# Patient Record
Sex: Male | Born: 1990 | Race: Black or African American | Hispanic: No | Marital: Single | Smoking: Never smoker
Health system: Southern US, Community
[De-identification: ages and names within clinical notes are randomized; demographics above are authoritative.]

## PROBLEM LIST (undated history)

## (undated) DIAGNOSIS — E119 Type 2 diabetes mellitus without complications: Secondary | ICD-10-CM

---

## 2014-10-01 ENCOUNTER — Emergency Department (HOSPITAL_COMMUNITY)
Admission: EM | Admit: 2014-10-01 | Discharge: 2014-10-01 | Disposition: A | Discharge status: Home / Self Care | Payer: PRIVATE HEALTH INSURANCE | Attending: Emergency Medicine | Admitting: Emergency Medicine

## 2014-10-01 ENCOUNTER — Encounter (HOSPITAL_COMMUNITY): Payer: Self-pay | Admitting: Emergency Medicine

## 2014-10-01 DIAGNOSIS — R739 Hyperglycemia, unspecified: Secondary | ICD-10-CM

## 2014-10-01 DIAGNOSIS — E1165 Type 2 diabetes mellitus with hyperglycemia: Secondary | ICD-10-CM | POA: Insufficient documentation

## 2014-10-01 DIAGNOSIS — K5900 Constipation, unspecified: Secondary | ICD-10-CM | POA: Insufficient documentation

## 2014-10-01 DIAGNOSIS — Z79899 Other long term (current) drug therapy: Secondary | ICD-10-CM | POA: Diagnosis not present

## 2014-10-01 HISTORY — DX: Type 2 diabetes mellitus without complications: E11.9

## 2014-10-01 LAB — CBC WITH DIFFERENTIAL/PLATELET
BASOS ABS: 0 10*3/uL (ref 0.0–0.1)
BASOS PCT: 1 % (ref 0–1)
Eosinophils Absolute: 0.1 10*3/uL (ref 0.0–0.7)
Eosinophils Relative: 2 % (ref 0–5)
HCT: 41.5 % (ref 39.0–52.0)
HEMOGLOBIN: 12.9 g/dL — AB (ref 13.0–17.0)
Lymphocytes Relative: 43 % (ref 12–46)
Lymphs Abs: 2.2 10*3/uL (ref 0.7–4.0)
MCH: 24.3 pg — ABNORMAL LOW (ref 26.0–34.0)
MCHC: 31.1 g/dL (ref 30.0–36.0)
MCV: 78.2 fL (ref 78.0–100.0)
Monocytes Absolute: 0.5 10*3/uL (ref 0.1–1.0)
Monocytes Relative: 10 % (ref 3–12)
Neutro Abs: 2.4 10*3/uL (ref 1.7–7.7)
Neutrophils Relative %: 44 % (ref 43–77)
Platelets: 266 10*3/uL (ref 150–400)
RBC: 5.31 MIL/uL (ref 4.22–5.81)
RDW: 13.5 % (ref 11.5–15.5)
WBC: 5.2 10*3/uL (ref 4.0–10.5)

## 2014-10-01 LAB — CBG MONITORING, ED: GLUCOSE-CAPILLARY: 235 mg/dL — AB (ref 70–99)

## 2014-10-01 LAB — URINALYSIS, ROUTINE W REFLEX MICROSCOPIC
Bilirubin Urine: NEGATIVE
HGB URINE DIPSTICK: NEGATIVE
Ketones, ur: NEGATIVE mg/dL
Leukocytes, UA: NEGATIVE
Nitrite: NEGATIVE
Protein, ur: NEGATIVE mg/dL
SPECIFIC GRAVITY, URINE: 1.041 — AB (ref 1.005–1.030)
Urobilinogen, UA: 0.2 mg/dL (ref 0.0–1.0)
pH: 6 (ref 5.0–8.0)

## 2014-10-01 LAB — COMPREHENSIVE METABOLIC PANEL
ALBUMIN: 4 g/dL (ref 3.5–5.2)
ALK PHOS: 50 U/L (ref 39–117)
ALT: 27 U/L (ref 0–53)
AST: 22 U/L (ref 0–37)
Anion gap: 6 (ref 5–15)
BUN: 7 mg/dL (ref 6–23)
CO2: 25 mmol/L (ref 19–32)
Calcium: 8.9 mg/dL (ref 8.4–10.5)
Chloride: 105 mmol/L (ref 96–112)
Creatinine, Ser: 0.61 mg/dL (ref 0.50–1.35)
GFR calc Af Amer: >90 mL/min (ref >90)
GFR calc non Af Amer: >90 mL/min (ref >90)
Glucose, Bld: 277 mg/dL — ABNORMAL HIGH (ref 70–99)
POTASSIUM: 3.8 mmol/L (ref 3.5–5.1)
SODIUM: 136 mmol/L (ref 135–145)
Total Bilirubin: 0.7 mg/dL (ref 0.3–1.2)
Total Protein: 7.3 g/dL (ref 6.0–8.3)

## 2014-10-01 LAB — URINE MICROSCOPIC-ADD ON

## 2014-10-01 LAB — LIPASE, BLOOD: Lipase: 18 U/L (ref 11–59)

## 2014-10-01 MED ORDER — SODIUM CHLORIDE 0.9 % IV BOLUS (SEPSIS)
500.0000 mL | Freq: Once | INTRAVENOUS | Status: AC
  Administered 2014-10-01: 500 mL via INTRAVENOUS

## 2014-10-01 MED ORDER — SENNOSIDES-DOCUSATE SODIUM 8.6-50 MG PO TABS: 1.0000 | ORAL_TABLET | Freq: Every evening | ORAL | Status: AC | PRN

## 2014-10-01 MED ORDER — SENNOSIDES-DOCUSATE SODIUM 8.6-50 MG PO TABS
1.0000 | ORAL_TABLET | Freq: Once | ORAL | Status: AC
  Administered 2014-10-01: 1 via ORAL
  Filled 2014-10-01: qty 1

## 2014-10-01 MED ORDER — SODIUM CHLORIDE 0.9 % IV BOLUS (SEPSIS): 1000.0000 mL | Freq: Once | INTRAVENOUS | Status: DC

## 2014-10-01 NOTE — ED Provider Notes (Signed)
CSN: [id number]     Arrival date & time 10/01/14  1029 History   First MD Initiated Contact with Patient 10/01/14 1051     Chief Complaint  Patient presents with  . Hyperglycemia     (Consider location/radiation/quality/duration/timing/severity/associated sxs/prior Treatment) HPI   William Hoffman is a 24 y.o. male with past medical history significant for non-insulin-dependent diabetes complaining of elevated blood glucose which he checked this morning (366) patient states that he has been running higher sugars and normal lately, states that he has pressure in the lower abdomen, he denies pain, nausea, vomiting, change in urination, fever, chills, cough, chest pain, shortness of breath testicular pain, swelling, urethral discharge, rash. Review of systems patient notes that he had a bowel movement this morning but he doesn't feel that he was fully relieved. His last good bowel movement was 2 days ago. No pain with defecation, bright red blood per rectum, melena.   Past Medical History  Diagnosis Date  . Diabetes mellitus without complication    History reviewed. No pertinent past surgical history. History reviewed. No pertinent family history. History  Substance Use Topics  . Smoking status: Never Smoker   . Smokeless tobacco: Not on file  . Alcohol Use: Yes     Comment: occasional    Review of Systems  10 systems reviewed and found to be negative, except as noted in the HPI.   Allergies  Review of patient's allergies indicates no known allergies.  Home Medications   Prior to Admission medications   Medication Sig Start Date End Date Taking? Authorizing Provider  glipiZIDE (GLUCOTROL) 10 MG tablet Take 10 mg by mouth 2 (two) times daily before a meal.   Yes Historical Provider, MD  metFORMIN (GLUCOPHAGE) 1000 MG tablet Take 1,000 mg by mouth 2 (two) times daily with a meal.   Yes Historical Provider, MD  Vitamin D, Ergocalciferol, (DRISDOL) 50000 UNITS CAPS capsule Take  50,000 Units by mouth every 7 (seven) days.   Yes Historical Provider, MD  senna-docusate (SENOKOT-S) 8.6-50 MG per tablet Take 1 tablet by mouth at bedtime as needed for mild constipation. 10/01/14   Ronaldo Crilly, PA-C   BP 114/63 mmHg  Pulse 61  Temp(Src) 97.2 F (36.2 C) (Oral)  Resp 17  SpO2 100% Physical Exam  Constitutional: He is oriented to person, place, and time. He appears well-developed and well-nourished. No distress.  HENT:  Head: Normocephalic and atraumatic.  Mouth/Throat: Oropharynx is clear and moist.  Eyes: Conjunctivae and EOM are normal. Pupils are equal, round, and reactive to light.  Neck: Normal range of motion.  Cardiovascular: Normal rate, regular rhythm and intact distal pulses.   Pulmonary/Chest: Effort normal and breath sounds normal. No stridor. No respiratory distress. He has no wheezes. He has no rales. He exhibits no tenderness.  Abdominal: Soft. Bowel sounds are normal. He exhibits no distension and no mass. There is no tenderness. There is no rebound and no guarding.  Musculoskeletal: Normal range of motion.  Neurological: He is alert and oriented to person, place, and time.  Psychiatric: He has a normal mood and affect.  Nursing note and vitals reviewed.   ED Course  Procedures (including critical care time) Labs Review Labs Reviewed  CBC WITH DIFFERENTIAL/PLATELET - Abnormal; Notable for the following:    Hemoglobin 12.9 (*)    MCH 24.3 (*)    All other components within normal limits  COMPREHENSIVE METABOLIC PANEL - Abnormal; Notable for the following:    Glucose, Bld 277 (*)  All other components within normal limits  URINALYSIS, ROUTINE W REFLEX MICROSCOPIC - Abnormal; Notable for the following:    Specific Gravity, Urine 1.041 (*)    Glucose, UA >1000 (*)    All other components within normal limits  CBG MONITORING, ED - Abnormal; Notable for the following:    Glucose-Capillary 235 (*)    All other components within normal limits   LIPASE, BLOOD  URINE MICROSCOPIC-ADD ON    Imaging Review No results found.   EKG Interpretation None      MDM   Final diagnoses:  Hyperglycemia without ketosis  Constipation, unspecified constipation type    Filed Vitals:   10/01/14 1038 10/01/14 1300  BP: 148/75 114/63  Pulse: 96 61  Temp: 97.2 F (36.2 C)   TempSrc: Oral   Resp: 18 17  SpO2: 100% 100%    Medications  sodium chloride 0.9 % bolus 500 mL (500 mLs Intravenous New Bag/Given 10/01/14 1301)  senna-docusate (Senokot-S) tablet 1 tablet (1 tablet Oral Given 10/01/14 1313)    Virgel ManifoldMalcolm Hoffman is a pleasant 24 y.o. male presenting with elevated blood glucose under 400 at home with associated lower abdominal feeling of pressure and distention. Likely secondary to mild constipation. Abdominal exam is benign. Blood glucose is elevated however he has a normal anion gap and is tolerating by mouth's. His urine is concentrated with a specific gravity of 1.041. Patient will be given 500 mL bolus, and encouraged him to start fresh fruits and vegetables, exercise regularly right him a prescription for Senokot.  Evaluation does not show pathology that would require ongoing emergent intervention or inpatient treatment. Pt is hemodynamically stable and mentating appropriately. Discussed findings and plan with patient/guardian, who agrees with care plan. All questions answered. Return precautions discussed and outpatient follow up given.   New Prescriptions   SENNA-DOCUSATE (SENOKOT-S) 8.6-50 MG PER TABLET    Take 1 tablet by mouth at bedtime as needed for mild constipation.         Wynetta Emeryicole Atheena Spano, PA-C 10/01/14 1341  Bethann BerkshireJoseph Zammit, MD 10/01/14 1531

## 2014-10-01 NOTE — ED Notes (Signed)
Pt denies n/v/d but reports abdominal pain for a week. Pt reports increased CBG; last took diabetic medication yesterday.

## 2014-10-01 NOTE — ED Notes (Signed)
Pt c/o abdominal pain and hyperglycemia onset today. Pt states CBG was 366 at home.

## 2014-10-01 NOTE — Discharge Instructions (Signed)
Please follow with your primary care doctor in the next 2 days for a check-up. They must obtain records for further management.   Do not hesitate to return to the Emergency Department for any new, worsening or concerning symptoms.   Take magnesium citrate 150mg  every 12 hours. You should continue to take Senakot. If this you have no relief in the the next 24 hours you should start taking miralax until you have a bowel movement. You may also try a fleet enema which are available over -the counter  Constipation Constipation is when a person has fewer than three bowel movements a week, has difficulty having a bowel movement, or has stools that are dry, hard, or larger than normal. As people grow older, constipation is more common. If you try to fix constipation with medicines that make you have a bowel movement (laxatives), the problem may get worse. Long-term laxative use may cause the muscles of the colon to become weak. A low-fiber diet, not taking in enough fluids, and taking certain medicines may make constipation worse.  CAUSES   Certain medicines, such as antidepressants, pain medicine, iron supplements, antacids, and water pills.   Certain diseases, such as diabetes, irritable bowel syndrome (IBS), thyroid disease, or depression.   Not drinking enough water.   Not eating enough fiber-rich foods.   Stress or travel.   Lack of physical activity or exercise.   Ignoring the urge to have a bowel movement.   Using laxatives too much.  SIGNS AND SYMPTOMS   Having fewer than three bowel movements a week.   Straining to have a bowel movement.   Having stools that are hard, dry, or larger than normal.   Feeling full or bloated.   Pain in the lower abdomen.   Not feeling relief after having a bowel movement.  DIAGNOSIS  Your health care provider will take a medical history and perform a physical exam. Further testing may be done for severe constipation. Some tests may  include:  A barium enema X-ray to examine your rectum, colon, and, sometimes, your small intestine.   A sigmoidoscopy to examine your lower colon.   A colonoscopy to examine your entire colon. TREATMENT  Treatment will depend on the severity of your constipation and what is causing it. Some dietary treatments include drinking more fluids and eating more fiber-rich foods. Lifestyle treatments may include regular exercise. If these diet and lifestyle recommendations do not help, your health care provider may recommend taking over-the-counter laxative medicines to help you have bowel movements. Prescription medicines may be prescribed if over-the-counter medicines do not work.  HOME CARE INSTRUCTIONS   Eat foods that have a lot of fiber, such as fruits, vegetables, whole grains, and beans.  Limit foods high in fat and processed sugars, such as french fries, hamburgers, cookies, candies, and soda.   A fiber supplement may be added to your diet if you cannot get enough fiber from foods.   Drink enough fluids to keep your urine clear or pale yellow.   Exercise regularly or as directed by your health care provider.   Go to the restroom when you have the urge to go. Do not hold it.   Only take over-the-counter or prescription medicines as directed by your health care provider. Do not take other medicines for constipation without talking to your health care provider first.  SEEK IMMEDIATE MEDICAL CARE IF:   You have bright red blood in your stool.   Your constipation lasts for more than  4 days or gets worse.   You have abdominal or rectal pain.   You have thin, pencil-like stools.   You have unexplained weight loss. MAKE SURE YOU:   Understand these instructions.  Will watch your condition.  Will get help right away if you are not doing well or get worse. Document Released: 03/04/2004 Document Revised: 06/11/2013 Document Reviewed: 03/18/2013 [facility] Patient  Information 2015 [place], [place]. This information is not intended to replace advice given to you by your health care provider. Make sure you discuss any questions you have with your health care provider.

## 2014-10-01 NOTE — ED Notes (Signed)
AVS explained in detail. Acknowledges he is to follow up with PCP in 2 days for further DM II management. Knows to increase fluids and alter diet with high fiber foods and to take Senakot for mild constipation. No other c/c. Ambulatory. AVS in hand.

## 2015-04-25 ENCOUNTER — Emergency Department (HOSPITAL_COMMUNITY): Payer: PRIVATE HEALTH INSURANCE

## 2015-04-25 ENCOUNTER — Emergency Department (HOSPITAL_COMMUNITY)
Admission: EM | Admit: 2015-04-25 | Discharge: 2015-04-25 | Disposition: A | Discharge status: Home / Self Care | Payer: PRIVATE HEALTH INSURANCE | Attending: Emergency Medicine | Admitting: Emergency Medicine

## 2015-04-25 ENCOUNTER — Encounter (HOSPITAL_COMMUNITY): Payer: Self-pay | Admitting: *Deleted

## 2015-04-25 DIAGNOSIS — R1084 Generalized abdominal pain: Secondary | ICD-10-CM | POA: Insufficient documentation

## 2015-04-25 DIAGNOSIS — E1165 Type 2 diabetes mellitus with hyperglycemia: Secondary | ICD-10-CM | POA: Diagnosis present

## 2015-04-25 DIAGNOSIS — Z79899 Other long term (current) drug therapy: Secondary | ICD-10-CM | POA: Diagnosis not present

## 2015-04-25 DIAGNOSIS — R350 Frequency of micturition: Secondary | ICD-10-CM

## 2015-04-25 DIAGNOSIS — R739 Hyperglycemia, unspecified: Secondary | ICD-10-CM

## 2015-04-25 DIAGNOSIS — J069 Acute upper respiratory infection, unspecified: Secondary | ICD-10-CM

## 2015-04-25 DIAGNOSIS — R05 Cough: Secondary | ICD-10-CM

## 2015-04-25 DIAGNOSIS — R059 Cough, unspecified: Secondary | ICD-10-CM

## 2015-04-25 LAB — CBC WITH DIFFERENTIAL/PLATELET
BASOS ABS: 0 10*3/uL (ref 0.0–0.1)
Basophils Relative: 0 %
EOS PCT: 3 %
Eosinophils Absolute: 0.1 10*3/uL (ref 0.0–0.7)
HEMATOCRIT: 44.4 % (ref 39.0–52.0)
HEMOGLOBIN: 14.4 g/dL (ref 13.0–17.0)
LYMPHS ABS: 2.4 10*3/uL (ref 0.7–4.0)
LYMPHS PCT: 48 %
MCH: 24.8 pg — AB (ref 26.0–34.0)
MCHC: 32.4 g/dL (ref 30.0–36.0)
MCV: 76.4 fL — AB (ref 78.0–100.0)
Monocytes Absolute: 0.5 10*3/uL (ref 0.1–1.0)
Monocytes Relative: 10 %
NEUTROS PCT: 39 %
Neutro Abs: 2 10*3/uL (ref 1.7–7.7)
PLATELETS: 204 10*3/uL (ref 150–400)
RBC: 5.81 MIL/uL (ref 4.22–5.81)
RDW: 13.6 % (ref 11.5–15.5)
WBC: 5 10*3/uL (ref 4.0–10.5)

## 2015-04-25 LAB — COMPREHENSIVE METABOLIC PANEL
ALT: 21 U/L (ref 17–63)
AST: 29 U/L (ref 15–41)
Albumin: 3.8 g/dL (ref 3.5–5.0)
Alkaline Phosphatase: 46 U/L (ref 38–126)
Anion gap: 6 (ref 5–15)
BILIRUBIN TOTAL: 0.9 mg/dL (ref 0.3–1.2)
BUN: 7 mg/dL (ref 6–20)
CHLORIDE: 104 mmol/L (ref 101–111)
CO2: 26 mmol/L (ref 22–32)
Calcium: 9.1 mg/dL (ref 8.9–10.3)
Creatinine, Ser: 0.72 mg/dL (ref 0.61–1.24)
GFR calc non Af Amer: >60 mL/min (ref >60)
Glucose, Bld: 272 mg/dL — ABNORMAL HIGH (ref 65–99)
Potassium: 4.9 mmol/L (ref 3.5–5.1)
Sodium: 136 mmol/L (ref 135–145)
Total Protein: 6.9 g/dL (ref 6.5–8.1)

## 2015-04-25 LAB — URINALYSIS, ROUTINE W REFLEX MICROSCOPIC
Bilirubin Urine: NEGATIVE
Glucose, UA: >1000 mg/dL — AB
HGB URINE DIPSTICK: NEGATIVE
Ketones, ur: 15 mg/dL — AB
LEUKOCYTES UA: NEGATIVE
Nitrite: NEGATIVE
PH: 5.5 (ref 5.0–8.0)
Protein, ur: NEGATIVE mg/dL
Specific Gravity, Urine: >1.046 — ABNORMAL HIGH (ref 1.005–1.030)
Urobilinogen, UA: 0.2 mg/dL (ref 0.0–1.0)

## 2015-04-25 LAB — I-STAT VENOUS BLOOD GAS, ED
ACID-BASE EXCESS: 6 mmol/L — AB (ref 0.0–2.0)
Bicarbonate: 32 mEq/L — ABNORMAL HIGH (ref 20.0–24.0)
O2 SAT: 44 %
PCO2 VEN: 49.2 mmHg (ref 45.0–50.0)
PO2 VEN: 24 mmHg — AB (ref 30.0–45.0)
TCO2: 33 mmol/L (ref 0–100)
pH, Ven: 7.421 — ABNORMAL HIGH (ref 7.250–7.300)

## 2015-04-25 LAB — URINE MICROSCOPIC-ADD ON

## 2015-04-25 LAB — LIPASE, BLOOD: Lipase: 20 U/L (ref 11–51)

## 2015-04-25 LAB — PHOSPHORUS: Phosphorus: 3.1 mg/dL (ref 2.5–4.6)

## 2015-04-25 LAB — CBG MONITORING, ED: Glucose-Capillary: 186 mg/dL — ABNORMAL HIGH (ref 65–99)

## 2015-04-25 LAB — MAGNESIUM: Magnesium: 1.8 mg/dL (ref 1.7–2.4)

## 2015-04-25 MED ORDER — SODIUM CHLORIDE 0.9 % IV BOLUS (SEPSIS)
2000.0000 mL | Freq: Once | INTRAVENOUS | Status: AC
  Administered 2015-04-25: 2000 mL via INTRAVENOUS

## 2015-04-25 NOTE — Discharge Instructions (Signed)
Increase water intake, decrease your carbohydrate intake. Follow up with your regular doctor in 1-2 weeks for recheck of symptoms and adjustment of medications. Continue to alternate between Tylenol and Ibuprofen for pain or fever. Use Mucinex for cough suppression/expectoration of mucus. Use netipot and flonase to help with nasal congestion. May consider over-the-counter Benadryl or other antihistamine to decrease secretions and for watery itchy eyes. Return to emergency department for emergent changing or worsening of symptoms.  Abdominal (belly) pain can be caused by many things. Your caregiver performed an examination and possibly ordered blood/urine tests and imaging (CT scan, x-rays, ultrasound). Many cases can be observed and treated at home after initial evaluation in the emergency department. Even though you are being discharged home, abdominal pain can be unpredictable. Therefore, you need a repeated exam if your pain does not resolve, returns, or worsens. Most patients with abdominal pain don't have to be admitted to the hospital or have surgery, but serious problems like appendicitis and gallbladder attacks can start out as nonspecific pain. Many abdominal conditions cannot be diagnosed in one visit, so follow-up evaluations are very important. SEEK IMMEDIATE MEDICAL ATTENTION IF YOU DEVELOP ANY OF THE FOLLOWING SYMPTOMS:  The pain does not go away or becomes severe.   A temperature above 101 develops.   Repeated vomiting occurs (multiple episodes).   The pain becomes localized to portions of the abdomen. The right side could possibly be appendicitis. In an adult, the left lower portion of the abdomen could be colitis or diverticulitis.   Blood is being passed in stools or vomit (bright red or black tarry stools).   Return also if you develop chest pain, difficulty breathing, dizziness or fainting, or become confused, poorly responsive, or inconsolable (young children).  The constipation  stays for more than 4 days.   There is belly (abdominal) or rectal pain.   You do not seem to be getting better.     Hyperglycemia Hyperglycemia occurs when the glucose (sugar) in your blood is too high. Hyperglycemia can happen for many reasons, but it most often happens to people who do not know they have diabetes or are not managing their diabetes properly.  CAUSES  Whether you have diabetes or not, there are other causes of hyperglycemia. Hyperglycemia can occur when you have diabetes, but it can also occur in other situations that you might not be as aware of, such as: Diabetes  If you have diabetes and are having problems controlling your blood glucose, hyperglycemia could occur because of some of the following reasons:  Not following your meal plan.  Not taking your diabetes medications or not taking it properly.  Exercising less or doing less activity than you normally do.  Being sick. Pre-diabetes  This cannot be ignored. Before people develop Type 2 diabetes, they almost always have "pre-diabetes." This is when your blood glucose levels are higher than normal, but not yet high enough to be diagnosed as diabetes. Research has shown that some long-term damage to the body, especially the heart and circulatory system, may already be occurring during pre-diabetes. If you take action to manage your blood glucose when you have pre-diabetes, you may delay or prevent Type 2 diabetes from developing. Stress  If you have diabetes, you may be "diet" controlled or on oral medications or insulin to control your diabetes. However, you may find that your blood glucose is higher than usual in the hospital whether you have diabetes or not. This is often referred to as "stress hyperglycemia."  Stress can elevate your blood glucose. This happens because of hormones put out by the body during times of stress. If stress has been the cause of your high blood glucose, it can be followed regularly by your  caregiver. That way he/she can make sure your hyperglycemia does not continue to get worse or progress to diabetes. Steroids  Steroids are medications that act on the infection fighting system (immune system) to block inflammation or infection. One side effect can be a rise in blood glucose. Most people can produce enough extra insulin to allow for this rise, but for those who cannot, steroids make blood glucose levels go even higher. It is not unusual for steroid treatments to "uncover" diabetes that is developing. It is not always possible to determine if the hyperglycemia will go away after the steroids are stopped. A special blood test called an A1c is sometimes done to determine if your blood glucose was elevated before the steroids were started. SYMPTOMS  Thirsty.  Frequent urination.  Dry mouth.  Blurred vision.  Tired or fatigue.  Weakness.  Sleepy.  Tingling in feet or leg. DIAGNOSIS  Diagnosis is made by monitoring blood glucose in one or all of the following ways:  A1c test. This is a chemical found in your blood.  Fingerstick blood glucose monitoring.  Laboratory results. TREATMENT  First, knowing the cause of the hyperglycemia is important before the hyperglycemia can be treated. Treatment may include, but is not be limited to:  Education.  Change or adjustment in medications.  Change or adjustment in meal plan.  Treatment for an illness, infection, etc.  More frequent blood glucose monitoring.  Change in exercise plan.  Decreasing or stopping steroids.  Lifestyle changes. HOME CARE INSTRUCTIONS   Test your blood glucose as directed.  Exercise regularly. Your caregiver will give you instructions about exercise. Pre-diabetes or diabetes which comes on with stress is helped by exercising.  Eat wholesome, balanced meals. Eat often and at regular, fixed times. Your caregiver or nutritionist will give you a meal plan to guide your sugar intake.  Being at  an ideal weight is important. If needed, losing as little as 10 to 15 pounds may help improve blood glucose levels. SEEK MEDICAL CARE IF:   You have questions about medicine, activity, or diet.  You continue to have symptoms (problems such as increased thirst, urination, or weight gain). SEEK IMMEDIATE MEDICAL CARE IF:   You are vomiting or have diarrhea.  Your breath smells fruity.  You are breathing faster or slower.  You are very sleepy or incoherent.  You have numbness, tingling, or pain in your feet or hands.  You have chest pain.  Your symptoms get worse even though you have been following your caregiver's orders.  If you have any other questions or concerns.   This information is not intended to replace advice given to you by your health care provider. Make sure you discuss any questions you have with your health care provider.   Document Released: 11/30/2000 Document Revised: 08/29/2011 Document Reviewed: 02/10/2015 Elsevier Interactive Patient Education 2016 [employer]  Upper Respiratory Infection, Adult Most upper respiratory infections (URIs) are caused by a virus. A URI affects the nose, throat, and upper air passages. The most common type of URI is often called "the common cold." HOME CARE   Take medicines only as told by your doctor.  Gargle warm saltwater or take cough drops to comfort your throat as told by your doctor.  Use a  warm mist humidifier or inhale steam from a shower to increase air moisture. This may make it easier to breathe.  Drink enough fluid to keep your pee (urine) clear or pale yellow.  Eat soups and other clear broths.  Have a healthy diet.  Rest as needed.  Go back to work when your fever is gone or your doctor says it is okay.  You may need to stay home longer to avoid giving your URI to others.  You can also wear a face mask and wash your hands often to prevent spread of the virus.  Use your inhaler more if you have  asthma.  Do not use any tobacco products, including cigarettes, chewing tobacco, or electronic cigarettes. If you need help quitting, ask your doctor. GET HELP IF:  You are getting worse, not better.  Your symptoms are not helped by medicine.  You have chills.  You are getting more short of breath.  You have brown or red mucus.  You have yellow or brown discharge from your nose.  You have pain in your face, especially when you bend forward.  You have a fever.  You have puffy (swollen) neck glands.  You have pain while swallowing.  You have white areas in the back of your throat. GET HELP RIGHT AWAY IF:   You have very bad or constant:  Headache.  Ear pain.  Pain in your forehead, behind your eyes, and over your cheekbones (sinus pain).  Chest pain.  You have long-lasting (chronic) lung disease and any of the following:  Wheezing.  Long-lasting cough.  Coughing up blood.  A change in your usual mucus.  You have a stiff neck.  You have changes in your:  Vision.  Hearing.  Thinking.  Mood. MAKE SURE YOU:   Understand these instructions.  Will watch your condition.  Will get help right away if you are not doing well or get worse.   This information is not intended to replace advice given to you by your health care provider. Make sure you discuss any questions you have with your health care provider.   Document Released: 11/23/2007 Document Revised: 10/21/2014 Document Reviewed: 09/11/2013 Elsevier Interactive Patient Education 2016 [employer]  Prediabetes Eating Plan Prediabetes--also called impaired glucose tolerance or impaired fasting glucose--is a condition that causes blood sugar (blood glucose) levels to be higher than normal. Following a healthy diet can help to keep prediabetes under control. It can also help to lower the risk of type 2 diabetes and heart disease, which are increased in people who have prediabetes. Along with regular  exercise, a healthy diet:  Promotes weight loss.  Helps to control blood sugar levels.  Helps to improve the way that the body uses insulin. WHAT DO I NEED TO KNOW ABOUT THIS EATING PLAN?  Use the glycemic index (GI) to plan your meals. The index tells you how quickly a food will raise your blood sugar. Choose low-GI foods. These foods take a longer time to raise blood sugar.  Pay close attention to the amount of carbohydrates in the food that you eat. Carbohydrates increase blood sugar levels.  Keep track of how many calories you take in. Eating the right amount of calories will help you to achieve a healthy weight. Losing about 7 percent of your starting weight can help to prevent type 2 diabetes.  You may want to follow a Mediterranean diet. This diet includes a lot of vegetables, lean meats or fish, whole grains, fruits, and  healthy oils and fats. WHAT FOODS CAN I EAT? Grains Whole grains, such as whole-wheat or whole-grain breads, crackers, cereals, and pasta. Unsweetened oatmeal. Bulgur. Barley. Quinoa. Brown rice. Corn or whole-wheat flour tortillas or taco shells. Vegetables Lettuce. Spinach. Peas. Beets. Cauliflower. Cabbage. Broccoli. Carrots. Tomatoes. Squash. Eggplant. Herbs. Peppers. Onions. Cucumbers. Brussels sprouts. Fruits Berries. Bananas. Apples. Oranges. Grapes. Papaya. Mango. Pomegranate. Kiwi. Grapefruit. Cherries. Meats and Other Protein Sources Seafood. Lean meats, such as chicken and [place] or lean cuts of pork and beef. Tofu. Eggs. Nuts. Beans. Dairy Low-fat or fat-free dairy products, such as yogurt, cottage cheese, and cheese. Beverages Water. Tea. Coffee. Sugar-free or diet soda. Seltzer water. Milk. Milk alternatives, such as soy or almond milk. Condiments Mustard. Relish. Low-fat, low-sugar ketchup. Low-fat, low-sugar barbecue sauce. Low-fat or fat-free mayonnaise. Sweets and Desserts Sugar-free or low-fat pudding. Sugar-free or low-fat ice cream and  other frozen treats. Fats and Oils Avocado. Walnuts. Olive oil. The items listed above may not be a complete list of recommended foods or beverages. Contact your dietitian for more options.  WHAT FOODS ARE NOT RECOMMENDED? Grains Refined white flour and flour products, such as bread, pasta, snack foods, and cereals. Beverages Sweetened drinks, such as sweet iced tea and soda. Sweets and Desserts Baked goods, such as cake, cupcakes, pastries, cookies, and cheesecake. The items listed above may not be a complete list of foods and beverages to avoid. Contact your dietitian for more information.   This information is not intended to replace advice given to you by your health care provider. Make sure you discuss any questions you have with your health care provider.   Document Released: 10/21/2014 Document Reviewed: 10/21/2014 Elsevier Interactive Patient Education 2016 [employer]  Abdominal Pain, Adult Many things can cause belly (abdominal) pain. Most times, the belly pain is not dangerous. Many cases of belly pain can be watched and treated at home. HOME CARE   Do not take medicines that help you go poop (laxatives) unless told to by your doctor.  Only take medicine as told by your doctor.  Eat or drink as told by your doctor. Your doctor will tell you if you should be on a special diet. GET HELP IF:  You do not know what is causing your belly pain.  You have belly pain while you are sick to your stomach (nauseous) or have runny poop (diarrhea).  You have pain while you pee or poop.  Your belly pain wakes you up at night.  You have belly pain that gets worse or better when you eat.  You have belly pain that gets worse when you eat fatty foods.  You have a fever. GET HELP RIGHT AWAY IF:   The pain does not go away within 2 hours.  You keep throwing up (vomiting).  The pain changes and is only in the right or left part of the belly.  You have bloody or tarry looking  poop. MAKE SURE YOU:   Understand these instructions.  Will watch your condition.  Will get help right away if you are not doing well or get worse.   This information is not intended to replace advice given to you by your health care provider. Make sure you discuss any questions you have with your health care provider.   Document Released: 11/23/2007 Document Revised: 06/27/2014 Document Reviewed: 02/13/2013 Elsevier Interactive Patient Education [employer]

## 2015-04-25 NOTE — ED Notes (Signed)
Pt reports high cbg >200 and frequent urination, today cbg 366.

## 2015-04-25 NOTE — ED Provider Notes (Signed)
CSN: [id number]     Arrival date & time 04/25/15  1107 History   First MD Initiated Contact with Patient 04/25/15 1114     Chief Complaint  Patient presents with  . Hyperglycemia     (Consider location/radiation/quality/duration/timing/severity/associated sxs/prior Treatment) HPI Comments: William Hoffman is a 24 y.o. male with a PMHx of DM2, who presents to the ED with complaints of hyperglycemia. Patient reports that he has had intermittent high blood sugars over the course of the last few weeks, checked it today and his CBG was 366. He has been compliant with all of his diabetes medications including albiglutide 30mg  weekly (last dose Tuesday), metformin 1000mg  BID last dose last night, and insulin glargine 10U QHS last dose last night. He reports he has had increased urinary frequency today, and 1-2wks of clear rhinorrhea and cough with yellow sputum production, as well as mild abdominal cramping which she describes as 5/10 lower abdominal nonradiating cramping which is constant worse with needing had a bowel movement and improved with Advil. Positive sick contacts at home stating that he has multiple family members with colds.  He denies any fevers, chills, chest pain, shortness breath, ear pain or drainage, sore throat, nausea, vomiting, diarrhea, constipation, melena, hematochezia, dysuria, hematuria, numbness, tingling, weakness, recent travel, suspicious food intake, alcohol use, NSAID use, or prior abdominal surgeries.  Patient is a 24 y.o. male presenting with hyperglycemia. The history is provided by the patient. No language interpreter was used.  Hyperglycemia Blood sugar level PTA:  366 Severity:  Moderate Onset quality:  Gradual Duration:  1 day Timing:  Intermittent Progression:  Waxing and waning Chronicity:  New Diabetes status:  Controlled with insulin and controlled with oral medications Current diabetic therapy:  Albiglutide 30mg  weekly on tuesdays, insulin glargine 10U  QHS, metformin 1000mg  BID Time since last antidiabetic medication:  12 hours Context: recent illness   Relieved by:  None tried Ineffective treatments:  None tried Associated symptoms: abdominal pain and polyuria   Associated symptoms: no chest pain, no confusion, no dysuria, no fever, no nausea, no shortness of breath, no vomiting and no weakness     Past Medical History  Diagnosis Date  . Diabetes mellitus without complication (HCC)    History reviewed. No pertinent past surgical history. History reviewed. No pertinent family history. Social History  Substance Use Topics  . Smoking status: Never Smoker   . Smokeless tobacco: None  . Alcohol Use: Yes     Comment: occasional    Review of Systems  Constitutional: Negative for fever and chills.  HENT: Positive for rhinorrhea. Negative for ear discharge, ear pain and sore throat.   Respiratory: Positive for cough. Negative for shortness of breath.   Cardiovascular: Negative for chest pain.  Gastrointestinal: Positive for abdominal pain. Negative for nausea, vomiting, diarrhea, constipation and blood in stool.  Endocrine: Positive for polyuria.  Genitourinary: Negative for dysuria and hematuria.  Musculoskeletal: Negative for myalgias and arthralgias.  Skin: Negative for color change.  Allergic/Immunologic: Negative for immunocompromised state.  Neurological: Negative for weakness and numbness.  Psychiatric/Behavioral: Negative for confusion.   10 Systems reviewed and are negative for acute change except as noted in the HPI.    Allergies  Review of patient's allergies indicates no known allergies.  Home Medications   Prior to Admission medications   Medication Sig Start Date End Date Taking? Authorizing Provider  glipiZIDE (GLUCOTROL) 10 MG tablet Take 10 mg by mouth 2 (two) times daily before a meal.  Historical Provider, MD  metFORMIN (GLUCOPHAGE) 1000 MG tablet Take 1,000 mg by mouth 2 (two) times daily with a meal.     Historical Provider, MD  senna-docusate (SENOKOT-S) 8.6-50 MG per tablet Take 1 tablet by mouth at bedtime as needed for mild constipation. 10/01/14   Nicole Pisciotta, PA-C  Vitamin D, Ergocalciferol, (DRISDOL) 50000 UNITS CAPS capsule Take 50,000 Units by mouth every 7 (seven) days.    Historical Provider, MD   BP 139/72 mmHg  Pulse 94  Temp(Src) 98.6 F (37 C) (Oral)  Resp 16  Ht  (1.803 m)  Wt 252 lb 6.4 oz (114.488 kg)  BMI 35.22 kg/m2  SpO2 100% Physical Exam  Constitutional: He is oriented to person, place, and time. Vital signs are normal. He appears well-developed and well-nourished.  Non-toxic appearance. No distress.  Afebrile, nontoxic, NAD  HENT:  Head: Normocephalic and atraumatic.  Mouth/Throat: Oropharynx is clear and moist and mucous membranes are normal.  Eyes: Conjunctivae and EOM are normal. Right eye exhibits no discharge. Left eye exhibits no discharge.  Neck: Normal range of motion. Neck supple.  Cardiovascular: Normal rate, regular rhythm, normal heart sounds and intact distal pulses.  Exam reveals no gallop and no friction rub.   No murmur heard. Pulmonary/Chest: Effort normal and breath sounds normal. No respiratory distress. He has no decreased breath sounds. He has no wheezes. He has no rhonchi. He has no rales.  CTAB in all lung fields, no w/r/r, no hypoxia or increased WOB, speaking in full sentences, SpO2 100% on RA   Abdominal: Soft. Normal appearance and bowel sounds are normal. He exhibits no distension. There is generalized tenderness. There is no rigidity, no rebound, no guarding, no CVA tenderness, no tenderness at McBurney's point and negative Murphy's sign.  Soft, nondistended, +BS throughout, with very mild diffuse tenderness, no r/g/r, neg murphy's, neg mcburney's, no CVA TTP   Musculoskeletal: Normal range of motion.  Neurological: He is alert and oriented to person, place, and time. He has normal strength. No sensory deficit.  Skin: Skin  is warm, dry and intact. No rash noted.  Psychiatric: He has a normal mood and affect.  Nursing note and vitals reviewed.   ED Course  Procedures (including critical care time) Labs Review Labs Reviewed  CBC WITH DIFFERENTIAL/PLATELET - Abnormal; Notable for the following:    MCV 76.4 (*)    MCH 24.8 (*)    All other components within normal limits  COMPREHENSIVE METABOLIC PANEL - Abnormal; Notable for the following:    Glucose, Bld 272 (*)    All other components within normal limits  URINALYSIS, ROUTINE W REFLEX MICROSCOPIC (NOT AT [facility]) - Abnormal; Notable for the following:    Specific Gravity, Urine >1.046 (*)    Glucose, UA >1000 (*)    Ketones, ur 15 (*)    All other components within normal limits  I-STAT VENOUS BLOOD GAS, ED - Abnormal; Notable for the following:    pH, Ven 7.421 (*)    pO2, Ven 24.0 (*)    Bicarbonate 32.0 (*)    Acid-Base Excess 6.0 (*)    All other components within normal limits  CBG MONITORING, ED - Abnormal; Notable for the following:    Glucose-Capillary 186 (*)    All other components within normal limits  LIPASE, BLOOD  MAGNESIUM  PHOSPHORUS  URINE MICROSCOPIC-ADD ON    Imaging Review Dg Abd Acute W/chest  04/25/2015  CLINICAL DATA:  24 year old male with a history of diabetes  mellitus. Cough. EXAM: DG ABDOMEN ACUTE W/ 1V CHEST COMPARISON:  None. FINDINGS: Chest: Cardiomediastinal silhouette within normal limits in size and contour. No evidence of pleural effusion, pneumothorax, or confluent airspace disease. Abdomen: Gas within small bowel, with gas and formed stool in the colon. No abnormally distended small bowel or colon. No unexpected radiopaque foreign body. No unexpected calcifications. Silhouette of the liver, spleen, bilateral kidneys unremarkable. No displaced fracture. IMPRESSION: Chest: No radiographic evidence of acute cardiopulmonary disease. Abdomen: Normal bowel gas pattern. Signed, Yvone Neu. Loreta Ave, DO [facility] Electronically Signed   By: Gilmer Mor D.O.   On: 04/25/2015 12:17   I have personally reviewed and evaluated these images and lab results as part of my medical decision-making.   EKG Interpretation None      MDM   Final diagnoses:  Hyperglycemia  Urinary frequency  Generalized abdominal pain  URI (upper respiratory infection)  Cough    24 y.o. male here with intermittent elevated blood sugars at home. Today he checked it was 366. States he has had cold symptoms and increased urinary frequency recently, as well as a few days of abdominal cramping she is not sure whether it is from the elevated blood sugar or not. On exam, mild diffuse abdominal tenderness with no focal peritoneal signs. Clear lung exam. Will give fluids, patient declines pain medicine. Will check labs, acute abd series, and give 2 L normal saline to see if we can get his blood sugar down without adding any insulin to his regimen. Will recheck shortly.   1:12 PM U/A with 15 ketones, no evidence of infection. VBG with no evidence of DKA, therefore ketones likely from slight dehydration. CBC WNL. CMP with gluc 272, no anion gap, no elevation in bicarb. Lipase, Mg, and Phos all WNL. Acute abd series unremarkable. Fluids still running, will check CBG after fluids finish. No evidence of infection on labs/imaging, could be that his insulin regimen needs to be adjusted. Will need to f/up with PCP for this. Will recheck shortly.   2:14 PM Recheck CBG 186 after 2L bolus. Discussed with pt that he likely needs adjustment to his diabetic regimen, or alter his diet/exercise regimen to help keep his glucose under control. URI symptomatic control discussed. Will have him f/up with his PCP in 1wk. I explained the diagnosis and have given explicit precautions to return to the ER including for any other new or worsening symptoms. The patient understands and accepts the medical plan  as it's been dictated and I have answered their questions. Discharge instructions concerning home care and prescriptions have been given. The patient is STABLE and is discharged to home in good condition.  BP 107/47 mmHg  Pulse 67  Temp(Src) 98.6 F (37 C) (Oral)  Resp 18  Ht  (1.803 m)  Wt 252 lb 6.4 oz (114.488 kg)  BMI 35.22 kg/m2  SpO2 100%  Meds ordered this encounter  Medications  . sodium chloride 0.9 % bolus 2,000 mL    Sig:      Pamela Maddy Camprubi-Soms, PA-C 04/25/15 1424  Gwyneth Sprout, MD 04/26/15 [phone]

## 2017-05-13 IMAGING — CR DG ABDOMEN ACUTE W/ 1V CHEST
3 series · 3 of 3 positions shown · non-contrast
Comparison: None.

CLINICAL DATA: 24-year-old male with a history of diabetes
mellitus. Cough.

EXAM:
DG ABDOMEN ACUTE W/ 1V CHEST

[chest pa]
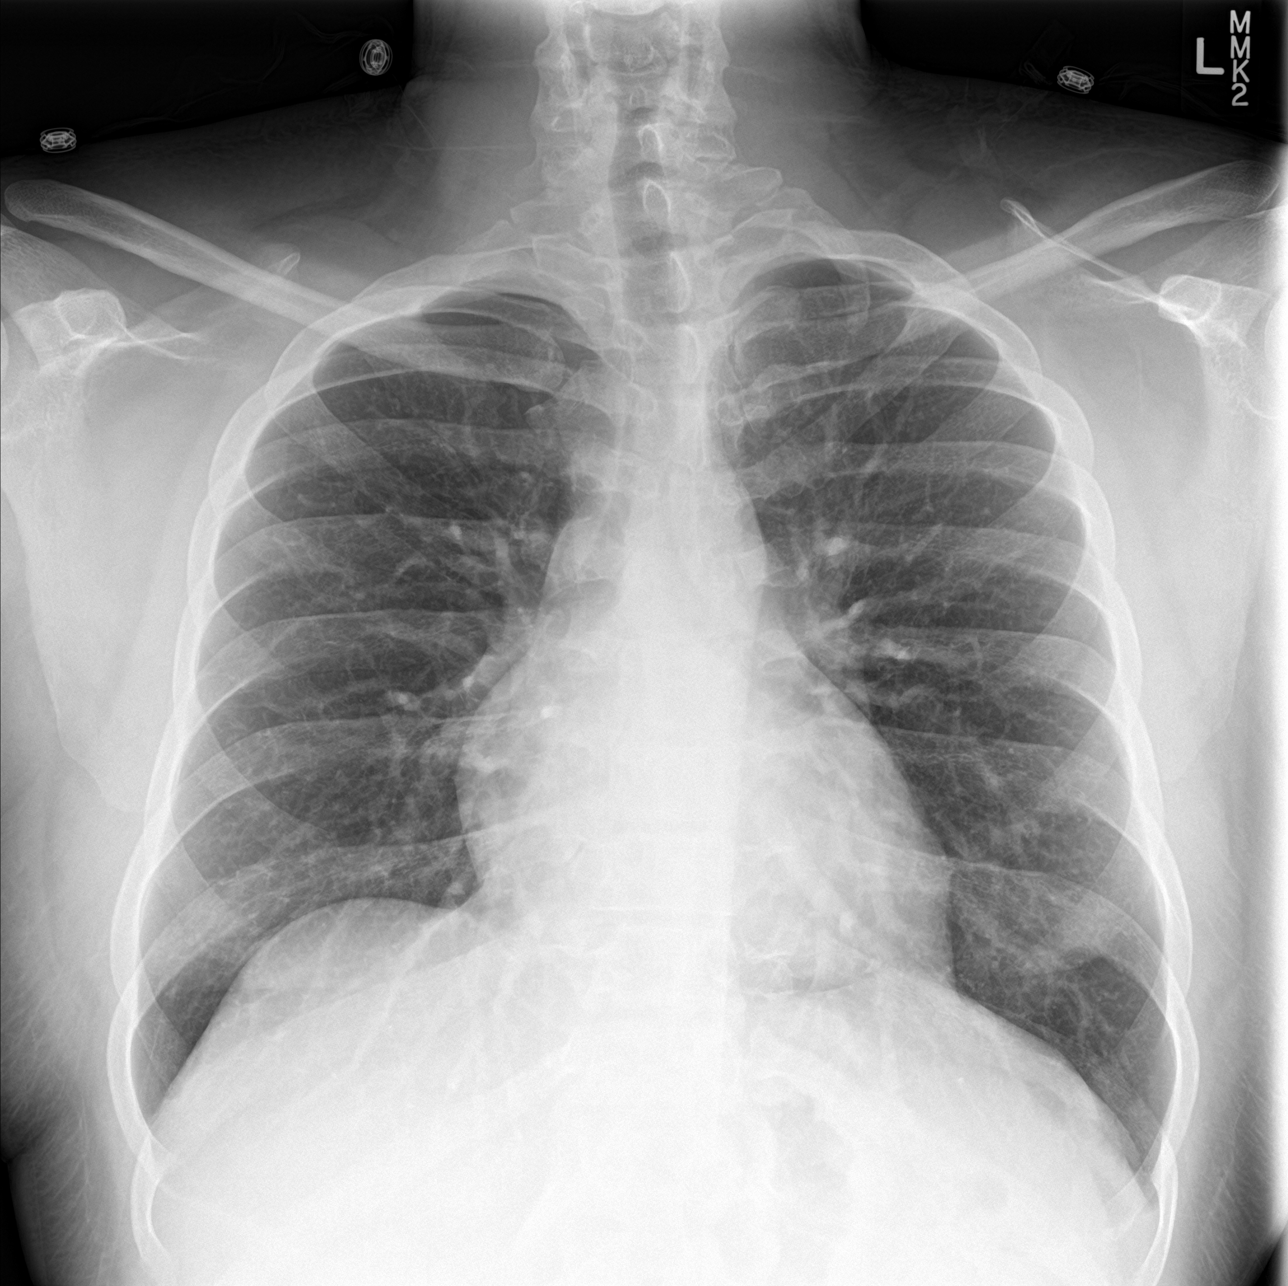

[abdomen erect]
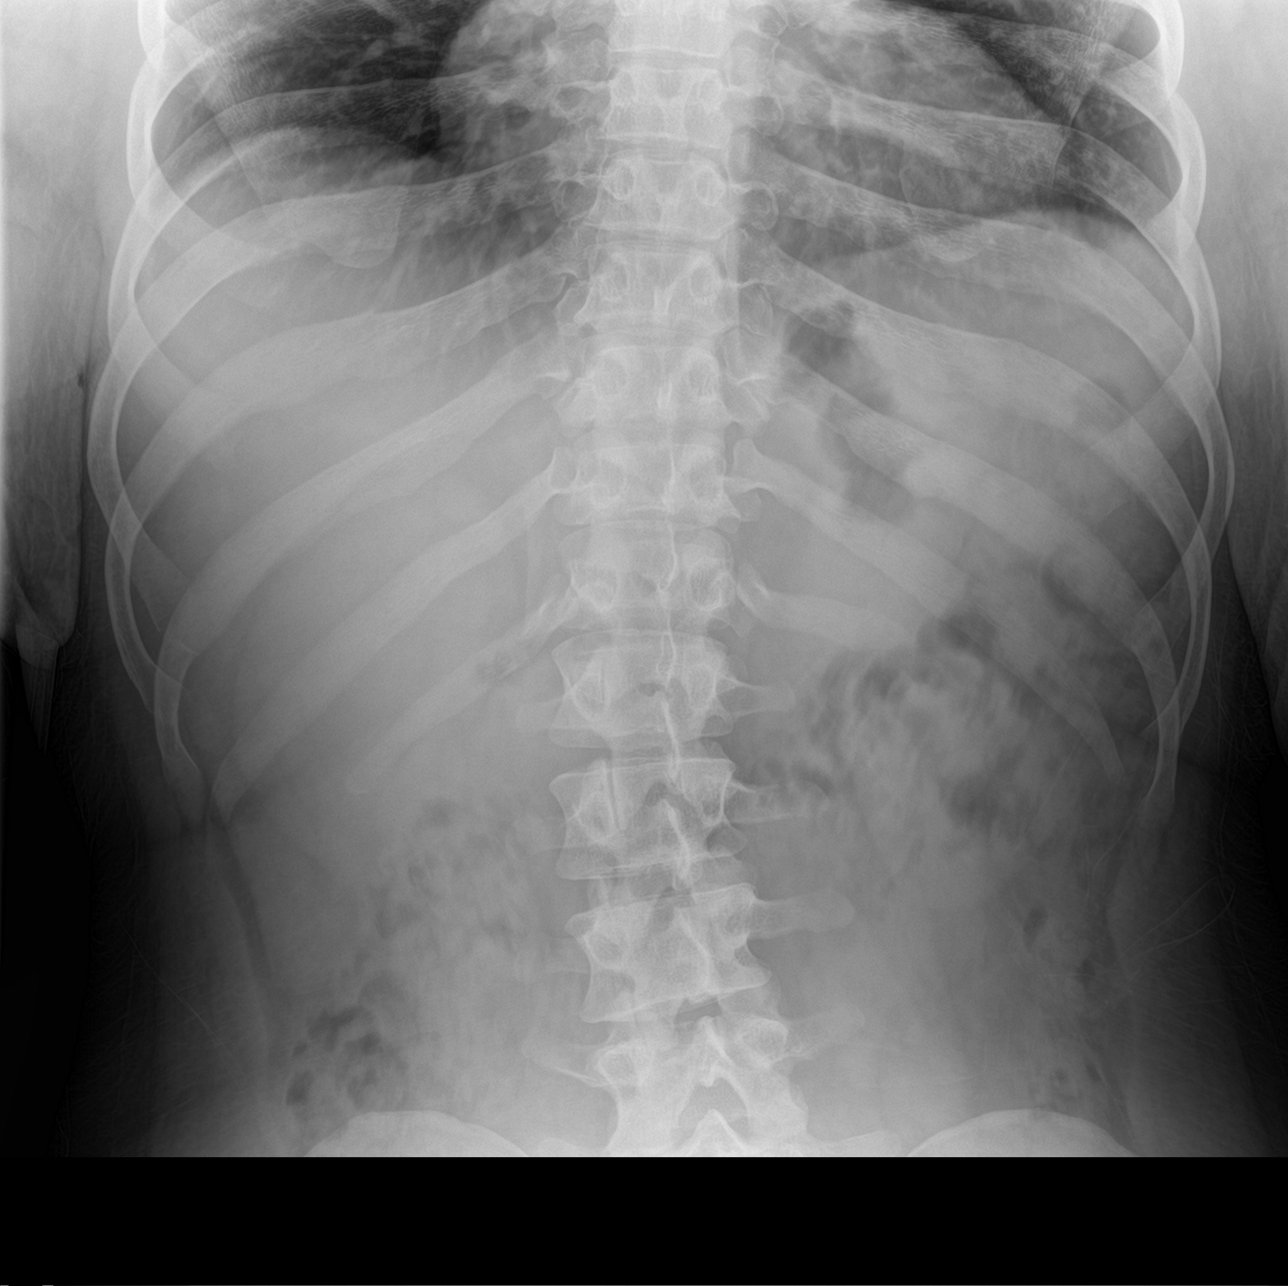

[abdomen supine]
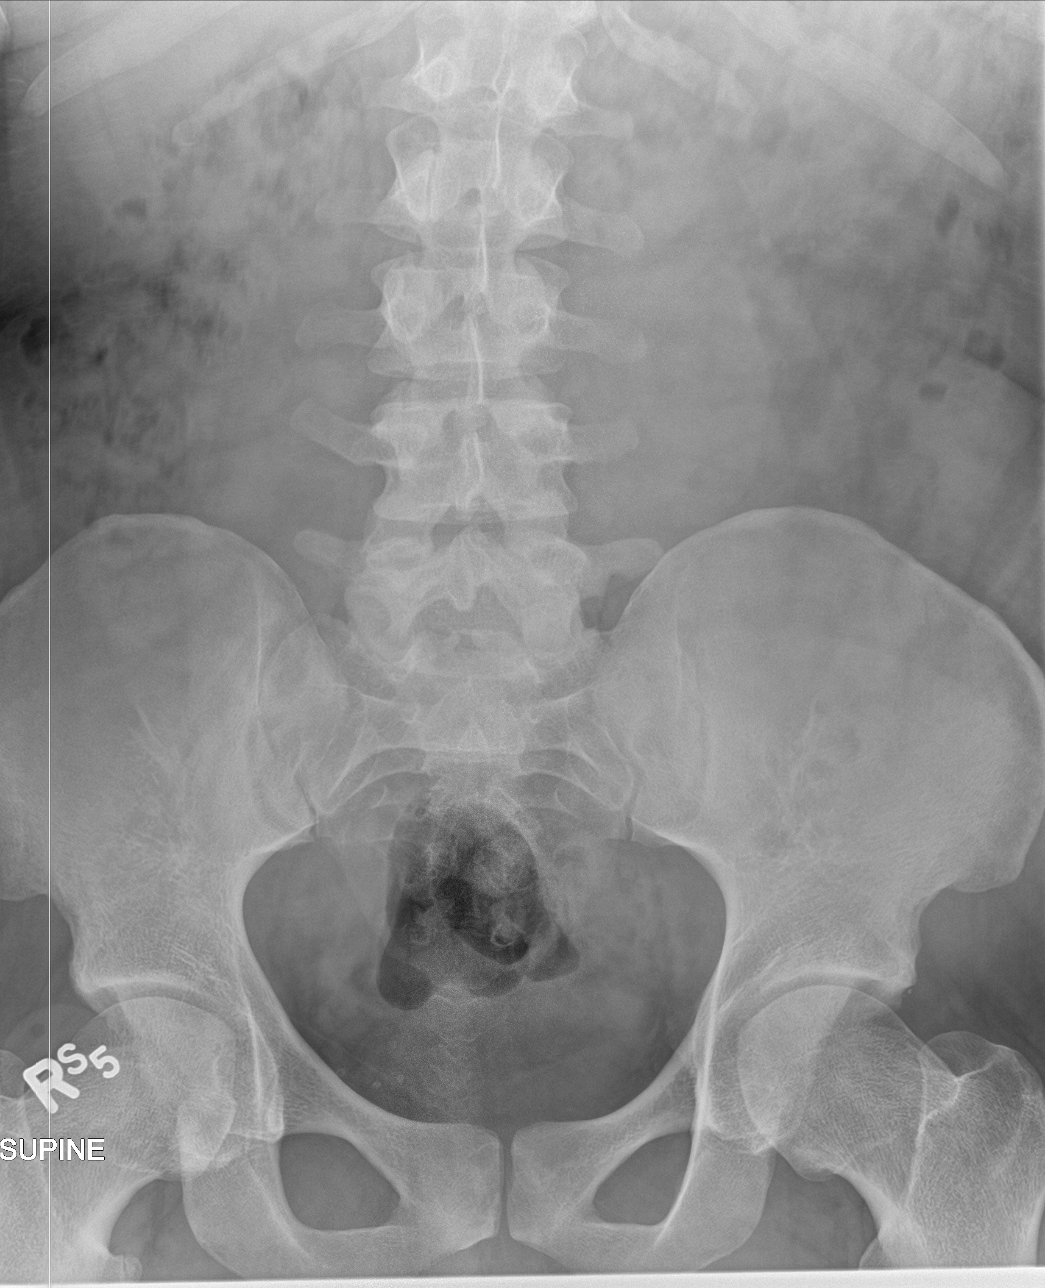

[3 of 3 positions shown; findings below may reference images not displayed]

FINDINGS: Chest:

Cardiomediastinal silhouette within normal limits in size and
contour.

No evidence of pleural effusion, pneumothorax, or confluent airspace
disease.

Abdomen:

Gas within small bowel, with gas and formed stool in the colon. No
abnormally distended small bowel or colon.

No unexpected radiopaque foreign body.

No unexpected calcifications.

Silhouette of the liver, spleen, bilateral kidneys unremarkable.

No displaced fracture.
IMPRESSION: Chest:

No radiographic evidence of acute cardiopulmonary disease.

Abdomen:

Normal bowel gas pattern.
